# Patient Record
Sex: Male | Born: 1950 | Race: White | Hispanic: No | Marital: Single | Smoking: Never smoker
Health system: Southern US, Community
[De-identification: ages and names within clinical notes are randomized; demographics above are authoritative.]

## PROBLEM LIST (undated history)

## (undated) DIAGNOSIS — J302 Other seasonal allergic rhinitis: Secondary | ICD-10-CM

## (undated) HISTORY — PX: HERNIA REPAIR: SHX51

## (undated) HISTORY — DX: Other seasonal allergic rhinitis: J30.2

---

## 2015-05-29 ENCOUNTER — Other Ambulatory Visit (INDEPENDENT_AMBULATORY_CARE_PROVIDER_SITE_OTHER): Payer: BLUE CROSS/BLUE SHIELD

## 2015-05-29 ENCOUNTER — Encounter: Payer: Self-pay | Admitting: Internal Medicine

## 2015-05-29 ENCOUNTER — Ambulatory Visit (INDEPENDENT_AMBULATORY_CARE_PROVIDER_SITE_OTHER): Payer: BLUE CROSS/BLUE SHIELD | Admitting: Internal Medicine

## 2015-05-29 VITALS — BP 150/98 | HR 81 | Ht 68.0 in | Wt 231.0 lb

## 2015-05-29 DIAGNOSIS — R058 Other specified cough: Secondary | ICD-10-CM

## 2015-05-29 DIAGNOSIS — R0600 Dyspnea, unspecified: Secondary | ICD-10-CM

## 2015-05-29 DIAGNOSIS — R05 Cough: Secondary | ICD-10-CM

## 2015-05-29 LAB — BASIC METABOLIC PANEL
BUN: 31 mg/dL — ABNORMAL HIGH (ref 6–23)
CHLORIDE: 106 meq/L (ref 96–112)
CO2: 27 meq/L (ref 19–32)
CREATININE: 0.95 mg/dL (ref 0.40–1.50)
Calcium: 9.5 mg/dL (ref 8.4–10.5)
GFR: 84.97 mL/min (ref >60.00)
GLUCOSE: 103 mg/dL — AB (ref 70–99)
Potassium: 4 mEq/L (ref 3.5–5.1)
SODIUM: 139 meq/L (ref 135–145)

## 2015-05-29 LAB — CBC WITH DIFFERENTIAL/PLATELET
Basophils Absolute: 0 10*3/uL (ref 0.0–0.1)
Basophils Relative: 0.5 % (ref 0.0–3.0)
Eosinophils Absolute: 0.1 10*3/uL (ref 0.0–0.7)
Eosinophils Relative: 2.1 % (ref 0.0–5.0)
HCT: 50.2 % (ref 39.0–52.0)
Hemoglobin: 16.8 g/dL (ref 13.0–17.0)
LYMPHS ABS: 2.1 10*3/uL (ref 0.7–4.0)
LYMPHS PCT: 30.9 % (ref 12.0–46.0)
MCHC: 33.4 g/dL (ref 30.0–36.0)
MCV: 88.4 fl (ref 78.0–100.0)
MONO ABS: 0.6 10*3/uL (ref 0.1–1.0)
Monocytes Relative: 8.2 % (ref 3.0–12.0)
NEUTROS ABS: 4 10*3/uL (ref 1.4–7.7)
Neutrophils Relative %: 58.3 % (ref 43.0–77.0)
Platelets: 193 10*3/uL (ref 150.0–400.0)
RBC: 5.68 Mil/uL (ref 4.22–5.81)
RDW: 14.1 % (ref 11.5–15.5)
WBC: 6.8 10*3/uL (ref 4.0–10.5)

## 2015-05-29 LAB — BRAIN NATRIURETIC PEPTIDE: Pro B Natriuretic peptide (BNP): 11 pg/mL (ref 0.0–100.0)

## 2015-05-29 LAB — TSH: TSH: 3 u[IU]/mL (ref 0.35–4.50)

## 2015-05-29 MED ORDER — FAMOTIDINE 20 MG PO TABS: ORAL_TABLET | ORAL | Status: DC

## 2015-05-29 MED ORDER — PANTOPRAZOLE SODIUM 40 MG PO TBEC: 40.0000 mg | DELAYED_RELEASE_TABLET | Freq: Every day | ORAL | Status: DC

## 2015-05-29 NOTE — Progress Notes (Signed)
   Subjective:    Patient ID: Jerry Ramos, male    DOB: 1951-03-13      MRN: [id number]  HPI  29 yowm never smoker with longstanding (? Early 2000s) winter time stuffy nose then  late summer/fall 2015 noted onset of sob/cough/choking esp bending over eval  By Dr Harrington Challenger x 2 and  uc x 2 rx with depo shot by Dr Harrington Challenger helped a lot for several days but then worse again and also inhaler made cough worse so referred to pulmonary clinic 05/29/2015 by Dr Harrington Challenger.   05/29/2015 1st [facility] office visit/ Jerry Ramos   Chief Complaint  Patient presents with  . Pulmonary Consult    Referred by Dr. Harrington Challenger for SOB and cough. Pt c/o SOB and wheezing with activity, prod cough with clear mucus. Denies any chest tightness/congestion.    sleeps in recline position x 10 year ? Related to obesity. abuptly worse since late     Review of Systems  Constitutional: Negative for fever and unexpected weight change.  HENT: Positive for sneezing. Negative for congestion, dental problem, ear pain, nosebleeds, postnasal drip, rhinorrhea, sinus pressure, sore throat and trouble swallowing.   Eyes: Negative for redness and itching.  Respiratory: Positive for cough, shortness of breath and wheezing. Negative for chest tightness.   Cardiovascular: Positive for leg swelling. Negative for palpitations.  Gastrointestinal: Negative for nausea and vomiting.  Genitourinary: Negative for dysuria.  Musculoskeletal: Positive for joint swelling.  Skin: Negative for rash.  Neurological: Negative for headaches.  Hematological: Bruises/bleeds easily.  Psychiatric/Behavioral: Negative for dysphoric mood. The patient is not nervous/anxious.        Objective:   Physical Exam  amb wm nad voice fatigue  Extremely harsh violent upper airway cough   Wt Readings from Last 3 Encounters:  05/29/15 231 lb (104.781 kg)    Vital signs reviewed   HEENT: nl dentition, turbinates, and orophanx. Nl external ear canals without cough  reflex   NECK :  without JVD/Nodes/TM/ nl carotid upstrokes bilaterally   LUNGS: no acc muscle use, clear to A and P bilaterally without cough on insp or exp maneuvers   CV:  RRR  no s3 or murmur or increase in P2, no edema   ABD:  soft and nontender with nl excursion in the supine position. No bruits or organomegaly, bowel sounds nl  MS:  warm without deformities, calf tenderness, cyanosis or clubbing  SKIN: warm and dry without lesions    NEURO:  alert, approp, no deficits    cxr 1st week May 2016 ok per pt    Lab 05/29/15 1622  NA 139  K 4.0  CL 106  CO2 27  BUN 31*  CREATININE 0.95  GLUCOSE 103*       Lab 05/29/15 1622  HGB 16.8  HCT 50.2  WBC 6.8  PLT 193.0     Lab Results  Component Value Date   TSH 3.00 05/29/2015     Lab Results  Component Value Date   PROBNP 11.0 05/29/2015      Lab Results  Component Value Date   DDIMER 0.27 05/29/2015          Assessment & Plan:

## 2015-05-29 NOTE — Assessment & Plan Note (Addendum)
The most common causes of chronic cough in immunocompetent adults include the following: upper airway cough syndrome (UACS), previously referred to as postnasal drip syndrome (PNDS), which is caused by variety of rhinosinus conditions; (2) asthma; (3) GERD; (4) chronic bronchitis from cigarette smoking or other inhaled environmental irritants; (5) nonasthmatic eosinophilic bronchitis; and (6) bronchiectasis.   These conditions, singly or in combination, have accounted for up to 94% of the causes of chronic cough in prospective studies.   Other conditions have constituted no >6% of the causes in prospective studies These have included bronchogenic carcinoma, chronic interstitial pneumonia, sarcoidosis, left ventricular failure, ACEI-induced cough, and aspiration from a condition associated with pharyngeal dysfunction.    Chronic cough is often simultaneously caused by more than one condition. A single cause has been found from 38 to 82% of the time, multiple causes from 18 to 62%. Multiply caused cough has been the result of three diseases up to 42% of the time.       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   These are the same pts (now being labeled as having "irritable larynx syndrome" by some cough centers) who not infrequently have a history of having failed to tolerate ace inhibitors,  dry powder inhalers or biphosphonates or report having atypical reflux symptoms that don't respond to standard doses of PPI , and are easily confused as  having aecopd or asthma flares by even experienced allergists/ pulmonologists.   The first step is to maximize acid suppression / diet rx then regroup if the cough persists after 4 weeks rx.  Comment:  The cough he demonstrated today bending over along with sudder hypersensivity to irritating fumes are typical of GERD related cough   See instructions for specific recommendations which were reviewed directly with the patient who was given a copy with highlighter outlining the key components.

## 2015-05-29 NOTE — Patient Instructions (Addendum)
  Pantoprazole (protonix) 40 mg   Take  30-60 min before first meal of the day and Pepcid (famotidine)  20 mg one @  bedtime until return to office - this is the best way to tell whether stomach acid is contributing to your problem.    GERD (REFLUX)  is an extremely common cause of respiratory symptoms just like yours , many times with no obvious heartburn at all.    It can be treated with medication, but also with lifestyle changes including avoidance of late meals, elevation of the head of your bed (ideally with 6 inch  bed blocks) excessive alcohol, smoking cessation, and avoid fatty foods, chocolate, peppermint, colas, red wine, and acidic juices such as orange juice.  NO MINT OR MENTHOL PRODUCTS SO NO COUGH DROPS  USE SUGARLESS CANDY INSTEAD (Jolley ranchers or Stover's or Life Savers) or even ice chips will also do - the key is to swallow to prevent all throat clearing. NO OIL BASED VITAMINS - use powdered substitutes.  Please remember to go to the lab department downstairs for your tests - we will call you with the results when they are available.  Pulmonary f/u is prn if better, back in 4 weeks if not

## 2015-05-30 ENCOUNTER — Encounter: Payer: Self-pay | Admitting: Internal Medicine

## 2015-05-30 LAB — D-DIMER, QUANTITATIVE (NOT AT ARMC): D DIMER QUANT: 0.27 ug{FEU}/mL (ref 0.00–0.48)

## 2015-05-30 NOTE — Assessment & Plan Note (Addendum)
-   05/29/2015  Walked RA x 3 laps @ 185 ft each stopped due to end of study, brisk pace, sats 91% at end but no sob       Symptoms are markedly disproportionate to objective findings and not clear this is a lung problem but pt does appear to have difficult airway management issues.  DDX of  difficult airways management all start with A and  include Adherence, Ace Inhibitors, Acid Reflux, Active Sinus Disease, Alpha 1 Antitripsin deficiency, Anxiety masquerading as Airways dz,  ABPA,  allergy(esp in young), Aspiration (esp in elderly), Adverse effects of meds,  Active smokers, A bunch of PE's (a small clot burden can't cause this syndrome unless there is already severe underlying pulm or vascular dz with poor reserve - and this low a d dimer excludes PE from ddx) ) plus two Bs  = Bronchiectasis and Beta blocker use..and one C= CHF   Cross referencing with his cough I strongly suspect GERD here with UACS/ pseudoasthma. See uacs

## 2015-06-01 LAB — ALLERGY FULL PROFILE
Allergen,Goose feathers, e70: <0.1 kU/L
Alternaria Alternata: <0.1 kU/L
Bahia Grass: <0.1 kU/L
Bermuda Grass: <0.1 kU/L
Box Elder IgE: <0.1 kU/L
Common Ragweed: <0.1 kU/L
Curvularia lunata: <0.1 kU/L
D. farinae: <0.1 kU/L
Elm IgE: <0.1 kU/L
G005 Rye, Perennial: <0.1 kU/L
G009 Red Top: <0.1 kU/L
Helminthosporium halodes: <0.1 kU/L
House Dust Hollister: <0.1 kU/L
IgE (Immunoglobulin E), Serum: 25 kU/L (ref <115)
Lamb's Quarters: <0.1 kU/L
Oak: <0.1 kU/L
Plantain: <0.1 kU/L
Stemphylium Botryosum: <0.1 kU/L
Sycamore Tree: <0.1 kU/L

## 2015-06-17 ENCOUNTER — Telehealth: Payer: Self-pay | Admitting: Internal Medicine

## 2015-06-17 NOTE — Telephone Encounter (Signed)
Labs done 05/29/15--no result note. Please advise MW thanks

## 2015-06-17 NOTE — Telephone Encounter (Signed)
I am sorry but I must have clicked on the wrong button and closed it but should have been called back completely nl with no evidence of a kidney or thryroid or heart problem blood clot or resp allergy so that narrows the usual list of suspects considerably and if not doing better ov with all active meds in hand to re group

## 2015-06-17 NOTE — Telephone Encounter (Signed)
lmomtcb x1 

## 2015-06-18 NOTE — Telephone Encounter (Signed)
lmtcb X 2 

## 2015-06-19 NOTE — Telephone Encounter (Signed)
LMTCB x 3 Letter mailed to patient to call back for results.  Will close message per triage protocol.

## 2015-06-23 ENCOUNTER — Telehealth: Payer: Self-pay | Admitting: Internal Medicine

## 2015-06-23 NOTE — Telephone Encounter (Signed)
Tanda Rockers, MD at 06/17/2015 3:06 PM     Status: Signed       Expand All Collapse All   I am sorry but I must have clicked on the wrong button and closed it but should have been called back completely nl with no evidence of a kidney or thryroid or heart problem blood clot or resp allergy so that narrows the usual list of suspects considerably and if not doing better ov with all active meds in hand to re group        Helene Kelp is aware of results. Nothing further was needed.

## 2015-06-26 ENCOUNTER — Telehealth: Payer: Self-pay | Admitting: Internal Medicine

## 2015-06-26 DIAGNOSIS — R059 Cough, unspecified: Secondary | ICD-10-CM

## 2015-06-26 DIAGNOSIS — R05 Cough: Secondary | ICD-10-CM

## 2015-06-26 NOTE — Telephone Encounter (Addendum)
Spoke with Joellen Jersey and Vallarie Mare to determine what to do with this TE: [facility] at [phone], spoke with billing dept. Gave them new diagnosis code for Cough R05; they will resubmit the bill with new diagnosis  Called and spoke to Helene Kelp, gave her [facility] phone number so she can contact them for follow up. Nothing further needed.

## 2015-06-26 NOTE — Telephone Encounter (Signed)
Patient received statement from [facility] lab for Allergy testing.  CPT Code [phone] They called the insurance company this morning and [employer] told them that they would not pay for this because they did not see that this test was medically necessary. Called - [phone] [employer] [place].  Called to speak to [facility] and they told me that I had to contact [place] [facility] [place] - [phone] [employer] main line Reference No. [id number] [place] [facility] [place] [phone], they told me to contact [facility]  Not sure what to do at this point... MW - any suggestions?

## 2015-06-26 NOTE — Telephone Encounter (Signed)
Jerry Ramos returning call call back [phone].Jerry Ramos

## 2015-06-26 NOTE — Telephone Encounter (Signed)
Left message for Jerry Ramos to call back.

## 2015-06-26 NOTE — Telephone Encounter (Signed)
I may have listed it under dyspnea but meant to place it under cough - if it's not approved for cough that's fine we can cancel it for now

## 2015-11-27 ENCOUNTER — Ambulatory Visit (INDEPENDENT_AMBULATORY_CARE_PROVIDER_SITE_OTHER): Payer: BLUE CROSS/BLUE SHIELD | Admitting: Internal Medicine

## 2015-11-27 ENCOUNTER — Encounter: Payer: Self-pay | Admitting: Internal Medicine

## 2015-11-27 ENCOUNTER — Ambulatory Visit (INDEPENDENT_AMBULATORY_CARE_PROVIDER_SITE_OTHER)
Admission: RE | Admit: 2015-11-27 | Discharge: 2015-11-27 | Disposition: A | Discharge status: Home / Self Care | Payer: BLUE CROSS/BLUE SHIELD | Source: Ambulatory Visit | Attending: Internal Medicine | Admitting: Internal Medicine

## 2015-11-27 VITALS — BP 130/100 | HR 78 | Ht 68.0 in | Wt 233.6 lb

## 2015-11-27 DIAGNOSIS — R05 Cough: Secondary | ICD-10-CM

## 2015-11-27 DIAGNOSIS — R058 Other specified cough: Secondary | ICD-10-CM

## 2015-11-27 DIAGNOSIS — R0600 Dyspnea, unspecified: Secondary | ICD-10-CM | POA: Diagnosis not present

## 2015-11-27 MED ORDER — PANTOPRAZOLE SODIUM 40 MG PO TBEC: 40.0000 mg | DELAYED_RELEASE_TABLET | Freq: Every day | ORAL | Status: AC

## 2015-11-27 MED ORDER — FAMOTIDINE 20 MG PO TABS: ORAL_TABLET | ORAL | Status: AC

## 2015-11-27 NOTE — Patient Instructions (Addendum)
Resume Pantoprazole (protonix) 40 mg   Take  30-60 min before first meal of the day and Pepcid (famotidine)  20 mg one @  bedtime until return to office - this is the best way to tell whether stomach acid is contributing to your problem.   GERD (REFLUX)  is an extremely common cause of respiratory symptoms just like yours , many times with no obvious heartburn at all.    It can be treated with medication, but also with lifestyle changes including elevation of the head of your bed (ideally with 6 inch  bed blocks),  Smoking cessation, avoidance of late meals, excessive alcohol, and avoid fatty foods, chocolate, peppermint, colas, red wine, and acidic juices such as orange juice.  NO MINT OR MENTHOL PRODUCTS SO NO COUGH DROPS  USE SUGARLESS CANDY INSTEAD (Jolley ranchers or Stover's or Life Savers) or even ice chips will also do - the key is to swallow to prevent all throat clearing. NO OIL BASED VITAMINS - use powdered substitutes.   Please see patient coordinator before you leave today  to schedule sinus ct and diagnostic esophagram   Please remember to go to the   x-ray department downstairs for your tests - we will call you with the results when they are available.   Weight control is simply a matter of calorie balance which needs to be tilted in your favor by eating less and exercising more.  To get the most out of exercise, you need to be continuously aware that you are short of breath, but never out of breath, for 30 minutes daily. As you improve, it will actually be easier for you to do the same amount of exercise  in  30 minutes so always push to the level where you are short of breath.  If this does not result in gradual weight reduction then I strongly recommend you see a nutritionist(per Dr Harrington Challenger) with a food diary x 2 weeks so that you can work out a negative calorie balance which is universally effective in steady weight loss programs.  Think of your calorie balance like you do your bank  account where in this case you want the balance to go down so you must take in less calories than you burn up.  It's just that simple:  Hard to do, but easy to understand.  Good luck!   Pulmonary follow up is as needed

## 2015-11-27 NOTE — Progress Notes (Signed)
Subjective:    Patient ID: Jerry Ramos, male    DOB: [birth year]      MRN: [id number]    Brief patient profile:  11 yowm never smoker with longstanding (? Early 2000s) winter time stuffy nose then  late summer/fall 2015 noted onset of sob/cough/choking esp bending over eval  By Dr Harrington Challenger x 2 and  uc x 2 rx with depo shot by Dr Harrington Challenger helped a lot for several days but then worse again and also inhaler made cough worse so referred to pulmonary clinic 05/29/2015 by Dr Harrington Challenger.     History of Present Illness  05/29/2015 1st [facility] office visit/ Shunda Rabadi   Chief Complaint  Patient presents with  . Pulmonary Consult    Referred by Dr. Harrington Challenger for SOB and cough. Pt c/o SOB and wheezing with activity, prod cough with clear mucus. Denies any chest tightness/congestion.    freq sleeps in recline position x 10 year ? Related to obesity.  Most the time when sleeping just flat with 2 pillows  rec Pantoprazole (protonix) 40 mg   Take  30-60 min before first meal of the day and Pepcid (famotidine)  20 mg one @  bedtime until return to office - this is the best way to tell whether stomach acid is contributing to your problem.   GERD diet   .Pulmonary f/u is prn if better, back in 4 weeks if not > was some  better until stopped med     11/27/2015  f/u ov/Phiona Ramnauth re: uacs worse off gerd rx Chief Complaint  Patient presents with  . Follow-up    Pt following for Dyspnea: pt states his breathing is about the same since he last been here. pt stateds the pepcid and protonix didnt help any with his issues. pt c/o SOB with even just putting on his boots and lacing them up, wheezing, and prod cough pale in color. pt also c/o hot flashes.  no c/o chest tightness.    Can walk flat fine, the problem is bending over > immediate sob > cough / min productive  No obvious day to day or daytime variability or assoc  cp or chest tightness, subjective wheeze or overt sinus or hb symptoms. No unusual exp hx or h/o childhood  pna/ asthma or knowledge of premature birth.  Sleeping ok without nocturnal  or early am exacerbation  of respiratory  c/o's or need for noct saba. Also denies any obvious fluctuation of symptoms with weather or environmental changes or other aggravating or alleviating factors except as outlined above   Current Medications, Allergies, Complete Past Medical History, Past Surgical History, Family History, and Social History were reviewed in [employer] record.  ROS  The following are not active complaints unless bolded sore throat, dysphagia, dental problems, itching, sneezing,  nasal congestion or excess/ purulent secretions, ear ache,   fever, chills, sweats, unintended wt loss, classically pleuritic or exertional cp, hemoptysis,  orthopnea pnd or leg swelling, presyncope, palpitations, abdominal pain, anorexia, nausea, vomiting, diarrhea  or change in bowel or bladder habits, change in stools or urine, dysuria,hematuria,  rash, arthralgias, visual complaints, headache, numbness, weakness or ataxia or problems with walking or coordination,  change in mood/affect or memory.         Objective:   Physical Exam  amb wm nad classic voice fatigue  / hopeless/ helpless affect     Wt Readings from Last 3 Encounters:  11/27/15 233 lb 9.6 oz (105.96 kg)  05/29/15 231 lb (104.781 kg)    Vital signs reviewed      HEENT: nl dentition, turbinates, and orophanx. Nl external ear canals without cough reflex   NECK :  without JVD/Nodes/TM/ nl carotid upstrokes bilaterally   LUNGS: no acc muscle use, clear to A and P bilaterally without cough on insp or exp maneuvers   CV:  RRR  no s3 or murmur or increase in P2, no edema   ABD:  Massively obese but soft and nontender with limited excursion in the supine position. No bruits or organomegaly, bowel sounds nl  MS:  warm without deformities, calf tenderness, cyanosis or clubbing  SKIN: warm and dry without lesions    NEURO:   alert, approp, no deficits       CXR PA and Lateral:   11/27/2015 :    I personally reviewed images and agree with radiology impression as follows:   No active cardiopulmonary disease.

## 2015-11-29 NOTE — Assessment & Plan Note (Signed)
Complicated by intermittent hbp/ restrictive lung physiology by spirometry 11/27/2015   Body mass index is 35.53   Lab Results  Component Value Date   TSH 3.00 05/29/2015     Contributing to gerd tendency/ doe/reviewed the need and the process to achieve and maintain neg calorie balance > defer f/u primary care including intermittently monitoring thyroid status

## 2015-11-29 NOTE — Assessment & Plan Note (Signed)
-   05/29/2015  Walked RA x 3 laps @ 185 ft each stopped due to end of study, brisk pace, sats 91% at end but no sob     - spirometry 11/27/2015 purely restrictive   Since no sob walking rec increase sub max ex to help get into neg calorie balance

## 2015-11-29 NOTE — Assessment & Plan Note (Addendum)
-   Allergy profile 05/29/15 >  Eos 0.1/ IgE 25   Neg RAST - max gerd rx 05/29/2015 > some better on it/ some worse off  Still very strongly suspect  Classic Upper airway cough syndrome, so named because it's frequently impossible to sort out how much is  CR/sinusitis with freq throat clearing (which can be related to primary GERD)   vs  causing  secondary (" extra esophageal")  GERD from wide swings in gastric pressure that occur with throat clearing, often  promoting self use of mint and menthol lozenges that reduce the lower esophageal sphincter tone and exacerbate the problem further in a cyclical fashion.   These are the same pts (now being labeled as having "irritable larynx syndrome" by some cough centers) who not infrequently have a history of having failed to tolerate ace inhibitors,  dry powder inhalers or biphosphonates or report having atypical reflux symptoms that don't respond to standard doses of PPI , and are easily confused as having aecopd or asthma flares by even experienced allergists/ pulmonologists.  The fact his breathing /coughing are fine until bends overy is very stronlgy indicative of gerd as the driving force behind the airway instability plus the restrictive effect on diaphragm excursion  I had an extended discussion with the patient reviewing all relevant studies completed to date and  lasting 15 to 20 minutes of a 25 minute visit on the following ongoing concerns:   1) The standardized cough guidelines published in Chest by Lissa Morales in 2006 are still the best available and consist of a multiple step process (up to 12!) , not a single office visit,  and are intended  to address this problem logically,  with an alogrithm dependent on response to empiric treatment at  each progressive step  to determine a specific diagnosis with  minimal addtional testing needed. Therefore if adherence is an issue or can't be accurately verified,  it's very unlikely the standard evaluation and  treatment will be successful here.    Furthermore, response to therapy (other than acute cough suppression, which should only be used short term with avoidance of narcotic containing cough syrups if possible), can be a gradual process for which the patient may perceive immediate benefit.  Unlike going to an eye doctor where the best perscription is almost always the first one and is immediately effective, this is almost never the case in the management of chronic cough syndromes. Therefore the patient needs to commit up front to consistently adhere to recommendations  for up to 6 weeks of therapy directed at the likely underlying problem(s) before the response can be reasonably evaluated.   2) next steps are Esophagram and sinus CT > ordered    3) Each maintenance medication was reviewed in detail including most importantly the difference between maintenance and as needed and under what circumstances the prns are to be used.  Please see instructions for details which were reviewed in writing and the patient given a copy.

## 2015-12-11 ENCOUNTER — Other Ambulatory Visit: Payer: Self-pay | Admitting: Internal Medicine

## 2015-12-11 ENCOUNTER — Ambulatory Visit
Admission: RE | Admit: 2015-12-11 | Discharge: 2015-12-11 | Disposition: A | Discharge status: Home / Self Care | Payer: BLUE CROSS/BLUE SHIELD | Source: Ambulatory Visit | Attending: Internal Medicine | Admitting: Internal Medicine

## 2015-12-11 DIAGNOSIS — R058 Other specified cough: Secondary | ICD-10-CM

## 2015-12-11 DIAGNOSIS — R05 Cough: Secondary | ICD-10-CM

## 2015-12-11 NOTE — Progress Notes (Signed)
Quick Note:  Spoke with pt and notified of results per Dr. Wert. Pt verbalized understanding and denied any questions.  ______ 

## 2016-09-28 IMAGING — CT CT PARANASAL SINUSES LIMITED
1 series · 10 of 12 positions shown, 13 images · non-contrast
Comparison: None.

CLINICAL DATA: Chronic maxillary sinus pressure and congestion.
Initial encounter.

EXAM:
CT PARANASAL SINUS LIMITED WITHOUT CONTRAST
TECHNIQUE: Non-contiguous multidetector CT images of the paranasal sinuses were
obtained in a single plane without contrast.

[Series 3: coronal soft · axial · 0.33mm/px · z∈[+36,+126]mm · 10 of 12 slices shown, 13 images]
[im 2/12  brain]
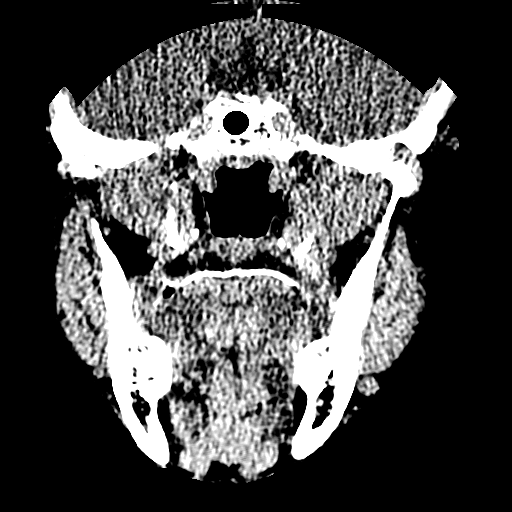
[im 2/12  bone]
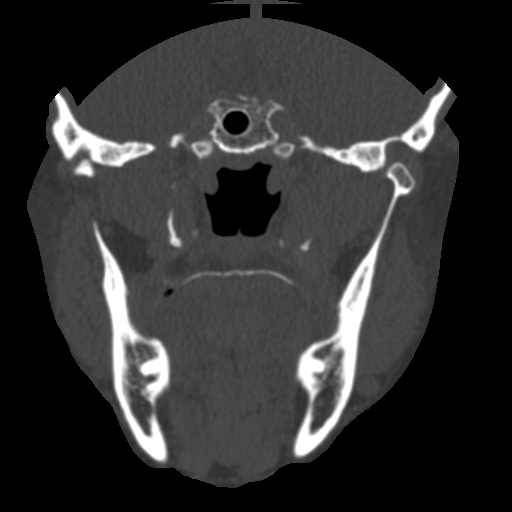
[im 3/12  bone]
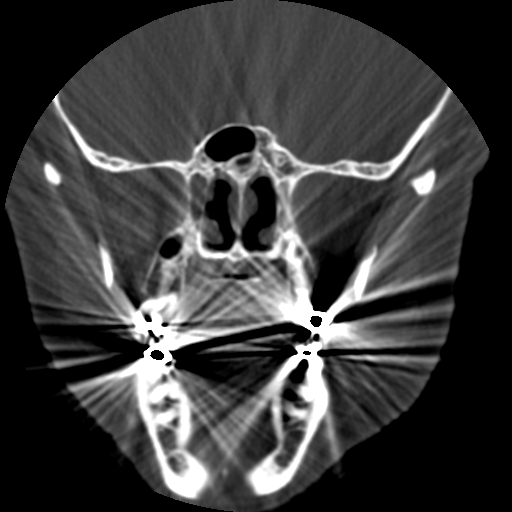
[im 4/12  bone]
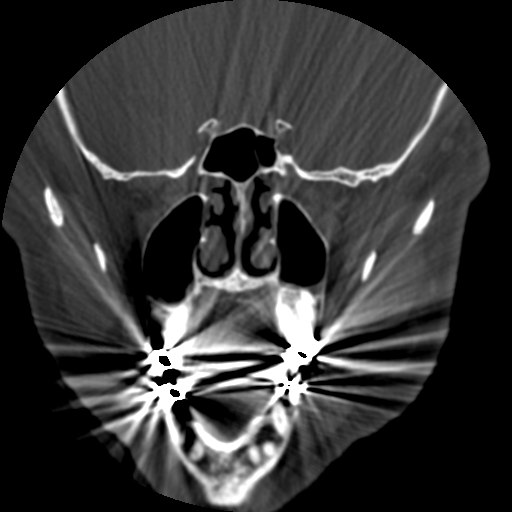
[im 5/12  bone]
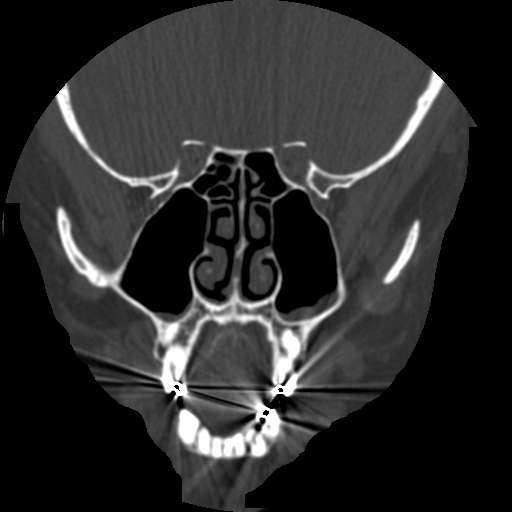
[im 6/12  brain]
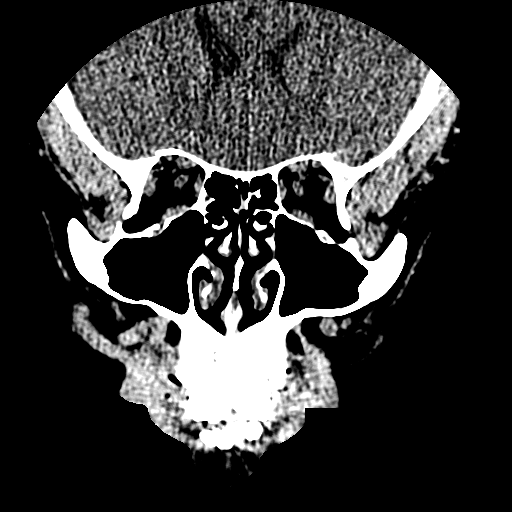
[im 6/12  bone]
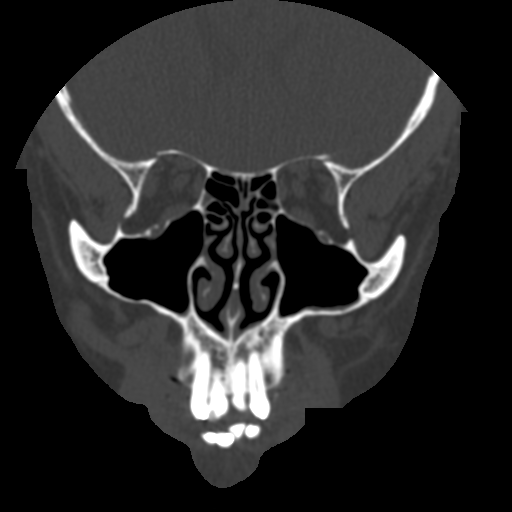
[im 7/12  bone]
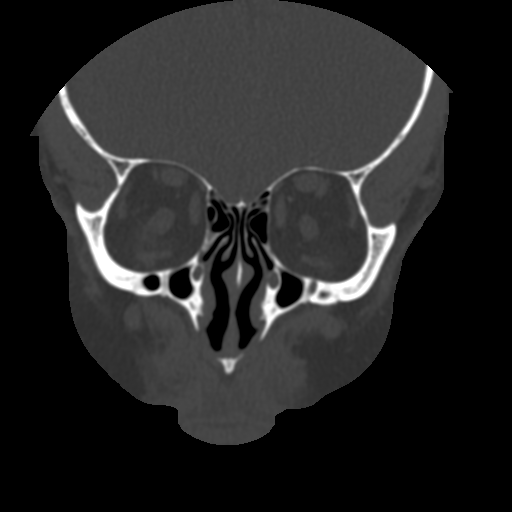
[im 8/12  bone]
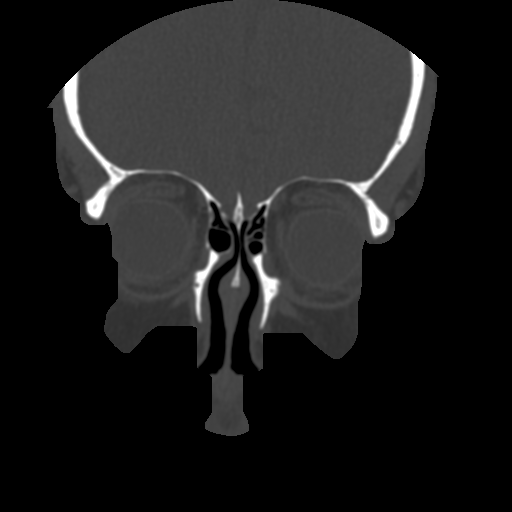
[im 9/12  bone]
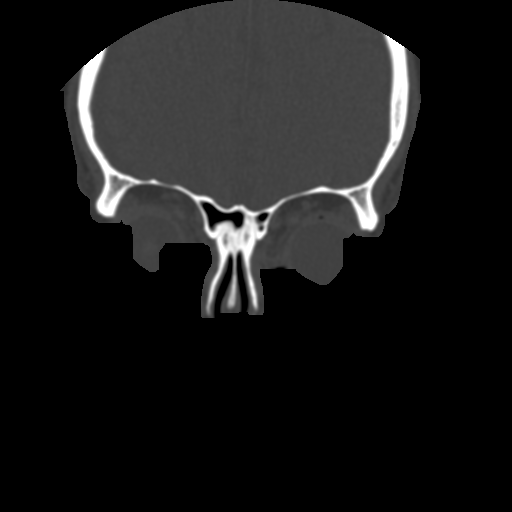
[im 10/12  brain]
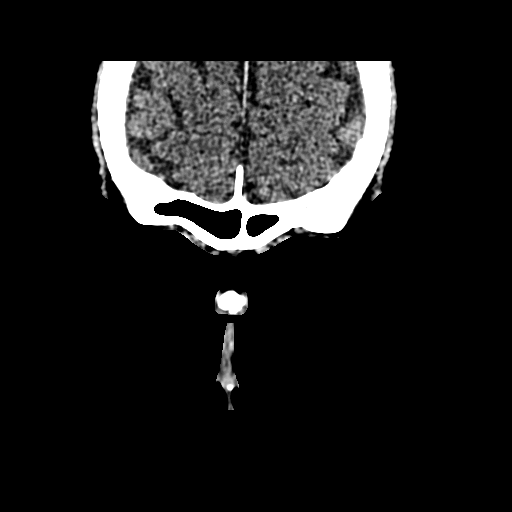
[im 10/12  bone]
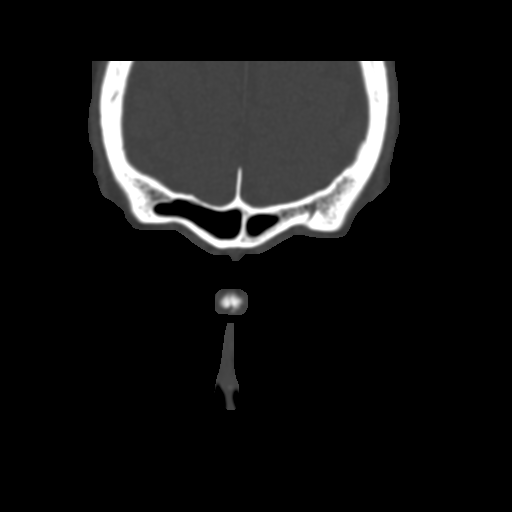
[im 11/12  bone]
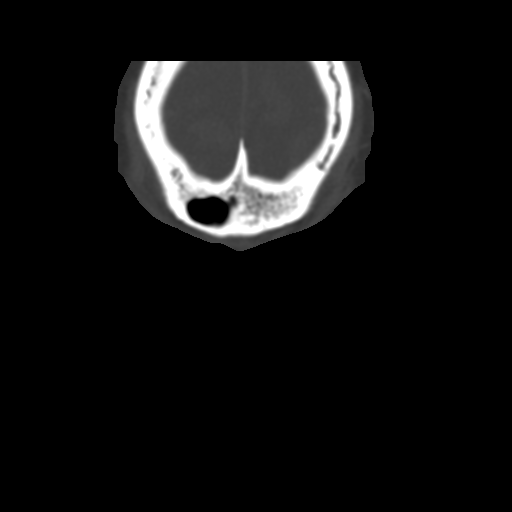

[10 of 12 positions shown; findings below may reference images not displayed]

FINDINGS: Very mild mucosal thickening is seen in the floors of the maxillary
sinuses, slightly greater on the right. Imaged paranasal sinuses are
otherwise clear. No sclerosis or thickening of the walls of the
paranasal sinuses is present. No air-fluid levels are identified in
the any of the sinuses. The examination is otherwise unremarkable.
IMPRESSION: Minimal mucosal thickening in the floors of the maxillary sinuses.
The examination is otherwise negative.

## 2017-01-30 DIAGNOSIS — R05 Cough: Secondary | ICD-10-CM | POA: Diagnosis not present

## 2017-01-30 DIAGNOSIS — J45909 Unspecified asthma, uncomplicated: Secondary | ICD-10-CM | POA: Diagnosis not present

## 2017-03-14 DIAGNOSIS — J45909 Unspecified asthma, uncomplicated: Secondary | ICD-10-CM | POA: Diagnosis not present

## 2017-03-14 DIAGNOSIS — I10 Essential (primary) hypertension: Secondary | ICD-10-CM | POA: Diagnosis not present

## 2017-06-26 DIAGNOSIS — R05 Cough: Secondary | ICD-10-CM | POA: Diagnosis not present

## 2017-06-26 DIAGNOSIS — R0609 Other forms of dyspnea: Secondary | ICD-10-CM | POA: Diagnosis not present

## 2017-06-26 DIAGNOSIS — R0600 Dyspnea, unspecified: Secondary | ICD-10-CM | POA: Diagnosis not present

## 2017-06-26 DIAGNOSIS — J329 Chronic sinusitis, unspecified: Secondary | ICD-10-CM | POA: Diagnosis not present

## 2017-07-07 DIAGNOSIS — R05 Cough: Secondary | ICD-10-CM | POA: Diagnosis not present

## 2017-07-07 DIAGNOSIS — R0600 Dyspnea, unspecified: Secondary | ICD-10-CM | POA: Diagnosis not present

## 2017-07-28 DIAGNOSIS — R05 Cough: Secondary | ICD-10-CM | POA: Diagnosis not present

## 2017-08-30 DIAGNOSIS — Z0000 Encounter for general adult medical examination without abnormal findings: Secondary | ICD-10-CM | POA: Diagnosis not present

## 2017-08-30 DIAGNOSIS — Z23 Encounter for immunization: Secondary | ICD-10-CM | POA: Diagnosis not present

## 2017-08-30 DIAGNOSIS — Z1159 Encounter for screening for other viral diseases: Secondary | ICD-10-CM | POA: Diagnosis not present

## 2017-08-30 DIAGNOSIS — Z125 Encounter for screening for malignant neoplasm of prostate: Secondary | ICD-10-CM | POA: Diagnosis not present

## 2017-08-30 DIAGNOSIS — Z13220 Encounter for screening for lipoid disorders: Secondary | ICD-10-CM | POA: Diagnosis not present

## 2017-08-30 DIAGNOSIS — I10 Essential (primary) hypertension: Secondary | ICD-10-CM | POA: Diagnosis not present

## 2017-11-06 DIAGNOSIS — R05 Cough: Secondary | ICD-10-CM | POA: Diagnosis not present

## 2017-11-06 DIAGNOSIS — R0602 Shortness of breath: Secondary | ICD-10-CM | POA: Diagnosis not present

## 2017-11-13 DIAGNOSIS — R39198 Other difficulties with micturition: Secondary | ICD-10-CM | POA: Diagnosis not present

## 2017-11-14 DIAGNOSIS — H40013 Open angle with borderline findings, low risk, bilateral: Secondary | ICD-10-CM | POA: Diagnosis not present

## 2017-11-14 DIAGNOSIS — H2513 Age-related nuclear cataract, bilateral: Secondary | ICD-10-CM | POA: Diagnosis not present

## 2017-11-17 DIAGNOSIS — R05 Cough: Secondary | ICD-10-CM | POA: Diagnosis not present

## 2017-11-17 DIAGNOSIS — R918 Other nonspecific abnormal finding of lung field: Secondary | ICD-10-CM | POA: Diagnosis not present

## 2018-01-11 DIAGNOSIS — H53412 Scotoma involving central area, left eye: Secondary | ICD-10-CM | POA: Diagnosis not present

## 2018-01-11 DIAGNOSIS — H40023 Open angle with borderline findings, high risk, bilateral: Secondary | ICD-10-CM | POA: Diagnosis not present

## 2018-01-22 DIAGNOSIS — R490 Dysphonia: Secondary | ICD-10-CM | POA: Diagnosis not present

## 2018-01-22 DIAGNOSIS — J383 Other diseases of vocal cords: Secondary | ICD-10-CM | POA: Diagnosis not present

## 2018-01-22 DIAGNOSIS — R05 Cough: Secondary | ICD-10-CM | POA: Diagnosis not present

## 2018-02-05 DIAGNOSIS — J383 Other diseases of vocal cords: Secondary | ICD-10-CM | POA: Diagnosis not present

## 2018-02-05 DIAGNOSIS — K219 Gastro-esophageal reflux disease without esophagitis: Secondary | ICD-10-CM | POA: Diagnosis not present

## 2018-02-05 DIAGNOSIS — K224 Dyskinesia of esophagus: Secondary | ICD-10-CM | POA: Diagnosis not present

## 2018-03-29 DIAGNOSIS — H5340 Unspecified visual field defects: Secondary | ICD-10-CM | POA: Diagnosis not present

## 2018-03-29 DIAGNOSIS — H40023 Open angle with borderline findings, high risk, bilateral: Secondary | ICD-10-CM | POA: Diagnosis not present

## 2018-04-10 DIAGNOSIS — K222 Esophageal obstruction: Secondary | ICD-10-CM | POA: Diagnosis not present

## 2018-04-10 DIAGNOSIS — Z1211 Encounter for screening for malignant neoplasm of colon: Secondary | ICD-10-CM | POA: Diagnosis not present

## 2018-04-10 DIAGNOSIS — R05 Cough: Secondary | ICD-10-CM | POA: Diagnosis not present

## 2018-04-10 DIAGNOSIS — Z1212 Encounter for screening for malignant neoplasm of rectum: Secondary | ICD-10-CM | POA: Diagnosis not present

## 2018-04-27 DIAGNOSIS — K222 Esophageal obstruction: Secondary | ICD-10-CM | POA: Diagnosis not present

## 2018-05-10 DIAGNOSIS — R0602 Shortness of breath: Secondary | ICD-10-CM | POA: Diagnosis not present

## 2018-05-10 DIAGNOSIS — R05 Cough: Secondary | ICD-10-CM | POA: Diagnosis not present

## 2018-05-10 DIAGNOSIS — K222 Esophageal obstruction: Secondary | ICD-10-CM | POA: Diagnosis not present

## 2018-05-11 DIAGNOSIS — R0602 Shortness of breath: Secondary | ICD-10-CM | POA: Diagnosis not present

## 2018-05-11 DIAGNOSIS — R05 Cough: Secondary | ICD-10-CM | POA: Diagnosis not present

## 2018-06-07 DIAGNOSIS — K210 Gastro-esophageal reflux disease with esophagitis: Secondary | ICD-10-CM | POA: Diagnosis not present

## 2018-06-07 DIAGNOSIS — R05 Cough: Secondary | ICD-10-CM | POA: Diagnosis not present

## 2018-06-07 DIAGNOSIS — K2950 Unspecified chronic gastritis without bleeding: Secondary | ICD-10-CM | POA: Diagnosis not present

## 2018-06-07 DIAGNOSIS — K5730 Diverticulosis of large intestine without perforation or abscess without bleeding: Secondary | ICD-10-CM | POA: Diagnosis not present

## 2018-06-07 DIAGNOSIS — K449 Diaphragmatic hernia without obstruction or gangrene: Secondary | ICD-10-CM | POA: Diagnosis not present

## 2018-06-07 DIAGNOSIS — K2980 Duodenitis without bleeding: Secondary | ICD-10-CM | POA: Diagnosis not present

## 2018-06-07 DIAGNOSIS — K3189 Other diseases of stomach and duodenum: Secondary | ICD-10-CM | POA: Diagnosis not present

## 2018-06-07 DIAGNOSIS — Z1211 Encounter for screening for malignant neoplasm of colon: Secondary | ICD-10-CM | POA: Diagnosis not present

## 2018-07-06 DIAGNOSIS — R110 Nausea: Secondary | ICD-10-CM | POA: Diagnosis not present

## 2018-10-02 DIAGNOSIS — H2513 Age-related nuclear cataract, bilateral: Secondary | ICD-10-CM | POA: Diagnosis not present

## 2018-10-02 DIAGNOSIS — H40023 Open angle with borderline findings, high risk, bilateral: Secondary | ICD-10-CM | POA: Diagnosis not present

## 2018-11-22 DIAGNOSIS — R634 Abnormal weight loss: Secondary | ICD-10-CM | POA: Diagnosis not present

## 2018-11-22 DIAGNOSIS — M5417 Radiculopathy, lumbosacral region: Secondary | ICD-10-CM | POA: Diagnosis not present

## 2018-11-22 DIAGNOSIS — G6289 Other specified polyneuropathies: Secondary | ICD-10-CM | POA: Diagnosis not present

## 2018-11-22 DIAGNOSIS — G609 Hereditary and idiopathic neuropathy, unspecified: Secondary | ICD-10-CM | POA: Diagnosis not present

## 2018-11-22 DIAGNOSIS — E538 Deficiency of other specified B group vitamins: Secondary | ICD-10-CM | POA: Diagnosis not present

## 2018-11-22 DIAGNOSIS — R202 Paresthesia of skin: Secondary | ICD-10-CM | POA: Diagnosis not present

## 2018-11-22 DIAGNOSIS — R201 Hypoesthesia of skin: Secondary | ICD-10-CM | POA: Diagnosis not present

## 2018-11-22 DIAGNOSIS — E559 Vitamin D deficiency, unspecified: Secondary | ICD-10-CM | POA: Diagnosis not present

## 2018-11-22 DIAGNOSIS — E531 Pyridoxine deficiency: Secondary | ICD-10-CM | POA: Diagnosis not present

## 2018-11-22 DIAGNOSIS — G5603 Carpal tunnel syndrome, bilateral upper limbs: Secondary | ICD-10-CM | POA: Diagnosis not present

## 2018-11-22 DIAGNOSIS — G2581 Restless legs syndrome: Secondary | ICD-10-CM | POA: Diagnosis not present

## 2018-11-22 DIAGNOSIS — M5412 Radiculopathy, cervical region: Secondary | ICD-10-CM | POA: Diagnosis not present

## 2018-11-22 DIAGNOSIS — G603 Idiopathic progressive neuropathy: Secondary | ICD-10-CM | POA: Diagnosis not present

## 2018-12-03 DIAGNOSIS — R0602 Shortness of breath: Secondary | ICD-10-CM | POA: Diagnosis not present

## 2018-12-03 DIAGNOSIS — K219 Gastro-esophageal reflux disease without esophagitis: Secondary | ICD-10-CM | POA: Diagnosis not present

## 2018-12-03 DIAGNOSIS — I10 Essential (primary) hypertension: Secondary | ICD-10-CM | POA: Diagnosis not present

## 2018-12-03 DIAGNOSIS — R0600 Dyspnea, unspecified: Secondary | ICD-10-CM | POA: Diagnosis not present

## 2018-12-03 DIAGNOSIS — R9389 Abnormal findings on diagnostic imaging of other specified body structures: Secondary | ICD-10-CM | POA: Diagnosis not present

## 2018-12-03 DIAGNOSIS — R05 Cough: Secondary | ICD-10-CM | POA: Diagnosis not present

## 2018-12-03 DIAGNOSIS — K222 Esophageal obstruction: Secondary | ICD-10-CM | POA: Diagnosis not present

## 2018-12-05 DIAGNOSIS — Z23 Encounter for immunization: Secondary | ICD-10-CM | POA: Diagnosis not present

## 2018-12-20 DIAGNOSIS — R202 Paresthesia of skin: Secondary | ICD-10-CM | POA: Diagnosis not present

## 2018-12-20 DIAGNOSIS — G5603 Carpal tunnel syndrome, bilateral upper limbs: Secondary | ICD-10-CM | POA: Diagnosis not present

## 2018-12-20 DIAGNOSIS — G2581 Restless legs syndrome: Secondary | ICD-10-CM | POA: Diagnosis not present

## 2018-12-20 DIAGNOSIS — M542 Cervicalgia: Secondary | ICD-10-CM | POA: Diagnosis not present

## 2018-12-20 DIAGNOSIS — M5417 Radiculopathy, lumbosacral region: Secondary | ICD-10-CM | POA: Diagnosis not present

## 2018-12-21 DIAGNOSIS — Z125 Encounter for screening for malignant neoplasm of prostate: Secondary | ICD-10-CM | POA: Diagnosis not present

## 2018-12-21 DIAGNOSIS — K219 Gastro-esophageal reflux disease without esophagitis: Secondary | ICD-10-CM | POA: Diagnosis not present

## 2018-12-21 DIAGNOSIS — I10 Essential (primary) hypertension: Secondary | ICD-10-CM | POA: Diagnosis not present

## 2019-01-21 DIAGNOSIS — M542 Cervicalgia: Secondary | ICD-10-CM | POA: Diagnosis not present

## 2019-01-21 DIAGNOSIS — G6289 Other specified polyneuropathies: Secondary | ICD-10-CM | POA: Diagnosis not present

## 2019-01-21 DIAGNOSIS — G2581 Restless legs syndrome: Secondary | ICD-10-CM | POA: Diagnosis not present

## 2019-01-21 DIAGNOSIS — M5417 Radiculopathy, lumbosacral region: Secondary | ICD-10-CM | POA: Diagnosis not present

## 2019-01-25 DIAGNOSIS — K219 Gastro-esophageal reflux disease without esophagitis: Secondary | ICD-10-CM | POA: Diagnosis not present

## 2019-01-25 DIAGNOSIS — R0600 Dyspnea, unspecified: Secondary | ICD-10-CM | POA: Diagnosis not present

## 2019-01-25 DIAGNOSIS — I10 Essential (primary) hypertension: Secondary | ICD-10-CM | POA: Diagnosis not present

## 2019-01-25 DIAGNOSIS — R05 Cough: Secondary | ICD-10-CM | POA: Diagnosis not present

## 2019-01-25 DIAGNOSIS — K222 Esophageal obstruction: Secondary | ICD-10-CM | POA: Diagnosis not present

## 2019-01-25 DIAGNOSIS — R0602 Shortness of breath: Secondary | ICD-10-CM | POA: Diagnosis not present

## 2019-01-25 DIAGNOSIS — R9389 Abnormal findings on diagnostic imaging of other specified body structures: Secondary | ICD-10-CM | POA: Diagnosis not present

## 2019-03-14 DIAGNOSIS — R202 Paresthesia of skin: Secondary | ICD-10-CM | POA: Diagnosis not present

## 2019-03-14 DIAGNOSIS — M5417 Radiculopathy, lumbosacral region: Secondary | ICD-10-CM | POA: Diagnosis not present

## 2019-03-14 DIAGNOSIS — M542 Cervicalgia: Secondary | ICD-10-CM | POA: Diagnosis not present

## 2019-03-14 DIAGNOSIS — G2581 Restless legs syndrome: Secondary | ICD-10-CM | POA: Diagnosis not present

## 2019-03-14 DIAGNOSIS — G5603 Carpal tunnel syndrome, bilateral upper limbs: Secondary | ICD-10-CM | POA: Diagnosis not present

## 2019-04-25 DIAGNOSIS — L821 Other seborrheic keratosis: Secondary | ICD-10-CM | POA: Diagnosis not present

## 2019-04-25 DIAGNOSIS — G603 Idiopathic progressive neuropathy: Secondary | ICD-10-CM | POA: Diagnosis not present

## 2019-04-25 DIAGNOSIS — G5603 Carpal tunnel syndrome, bilateral upper limbs: Secondary | ICD-10-CM | POA: Diagnosis not present

## 2019-04-25 DIAGNOSIS — G2581 Restless legs syndrome: Secondary | ICD-10-CM | POA: Diagnosis not present

## 2019-04-25 DIAGNOSIS — M542 Cervicalgia: Secondary | ICD-10-CM | POA: Diagnosis not present

## 2019-04-25 DIAGNOSIS — L570 Actinic keratosis: Secondary | ICD-10-CM | POA: Diagnosis not present

## 2019-05-02 DIAGNOSIS — R05 Cough: Secondary | ICD-10-CM | POA: Diagnosis not present

## 2019-05-02 DIAGNOSIS — R062 Wheezing: Secondary | ICD-10-CM | POA: Diagnosis not present

## 2019-05-02 DIAGNOSIS — J310 Chronic rhinitis: Secondary | ICD-10-CM | POA: Diagnosis not present

## 2019-08-01 DIAGNOSIS — M5417 Radiculopathy, lumbosacral region: Secondary | ICD-10-CM | POA: Diagnosis not present

## 2019-08-01 DIAGNOSIS — G2581 Restless legs syndrome: Secondary | ICD-10-CM | POA: Diagnosis not present

## 2019-08-01 DIAGNOSIS — G603 Idiopathic progressive neuropathy: Secondary | ICD-10-CM | POA: Diagnosis not present

## 2019-08-01 DIAGNOSIS — M542 Cervicalgia: Secondary | ICD-10-CM | POA: Diagnosis not present

## 2019-09-17 DIAGNOSIS — G603 Idiopathic progressive neuropathy: Secondary | ICD-10-CM | POA: Diagnosis not present

## 2019-09-17 DIAGNOSIS — G2581 Restless legs syndrome: Secondary | ICD-10-CM | POA: Diagnosis not present

## 2019-09-17 DIAGNOSIS — M5417 Radiculopathy, lumbosacral region: Secondary | ICD-10-CM | POA: Diagnosis not present

## 2019-09-17 DIAGNOSIS — G5603 Carpal tunnel syndrome, bilateral upper limbs: Secondary | ICD-10-CM | POA: Diagnosis not present

## 2019-09-17 DIAGNOSIS — R202 Paresthesia of skin: Secondary | ICD-10-CM | POA: Diagnosis not present

## 2019-09-17 DIAGNOSIS — M5412 Radiculopathy, cervical region: Secondary | ICD-10-CM | POA: Diagnosis not present

## 2019-10-15 DIAGNOSIS — M542 Cervicalgia: Secondary | ICD-10-CM | POA: Diagnosis not present

## 2019-10-15 DIAGNOSIS — G6289 Other specified polyneuropathies: Secondary | ICD-10-CM | POA: Diagnosis not present

## 2019-10-15 DIAGNOSIS — G2581 Restless legs syndrome: Secondary | ICD-10-CM | POA: Diagnosis not present

## 2019-10-15 DIAGNOSIS — G5601 Carpal tunnel syndrome, right upper limb: Secondary | ICD-10-CM | POA: Diagnosis not present

## 2019-10-15 DIAGNOSIS — M5417 Radiculopathy, lumbosacral region: Secondary | ICD-10-CM | POA: Diagnosis not present

## 2019-10-18 DIAGNOSIS — Z23 Encounter for immunization: Secondary | ICD-10-CM | POA: Diagnosis not present

## 2021-01-12 DIAGNOSIS — M5412 Radiculopathy, cervical region: Secondary | ICD-10-CM | POA: Diagnosis not present

## 2021-01-12 DIAGNOSIS — R202 Paresthesia of skin: Secondary | ICD-10-CM | POA: Diagnosis not present

## 2021-01-12 DIAGNOSIS — M542 Cervicalgia: Secondary | ICD-10-CM | POA: Diagnosis not present

## 2021-01-12 DIAGNOSIS — G5603 Carpal tunnel syndrome, bilateral upper limbs: Secondary | ICD-10-CM | POA: Diagnosis not present

## 2021-02-12 DIAGNOSIS — D239 Other benign neoplasm of skin, unspecified: Secondary | ICD-10-CM | POA: Diagnosis not present

## 2021-02-12 DIAGNOSIS — L578 Other skin changes due to chronic exposure to nonionizing radiation: Secondary | ICD-10-CM | POA: Diagnosis not present

## 2021-02-12 DIAGNOSIS — L570 Actinic keratosis: Secondary | ICD-10-CM | POA: Diagnosis not present

## 2021-02-12 DIAGNOSIS — L219 Seborrheic dermatitis, unspecified: Secondary | ICD-10-CM | POA: Diagnosis not present

## 2021-02-12 DIAGNOSIS — L821 Other seborrheic keratosis: Secondary | ICD-10-CM | POA: Diagnosis not present

## 2021-02-12 DIAGNOSIS — D225 Melanocytic nevi of trunk: Secondary | ICD-10-CM | POA: Diagnosis not present

## 2021-03-09 DIAGNOSIS — G5603 Carpal tunnel syndrome, bilateral upper limbs: Secondary | ICD-10-CM | POA: Diagnosis not present

## 2021-03-09 DIAGNOSIS — R202 Paresthesia of skin: Secondary | ICD-10-CM | POA: Diagnosis not present

## 2021-03-09 DIAGNOSIS — M5412 Radiculopathy, cervical region: Secondary | ICD-10-CM | POA: Diagnosis not present

## 2021-03-09 DIAGNOSIS — M542 Cervicalgia: Secondary | ICD-10-CM | POA: Diagnosis not present

## 2021-08-02 DIAGNOSIS — Z23 Encounter for immunization: Secondary | ICD-10-CM | POA: Diagnosis not present

## 2021-12-08 DIAGNOSIS — E7800 Pure hypercholesterolemia, unspecified: Secondary | ICD-10-CM | POA: Diagnosis not present

## 2021-12-08 DIAGNOSIS — G629 Polyneuropathy, unspecified: Secondary | ICD-10-CM | POA: Diagnosis not present

## 2021-12-08 DIAGNOSIS — I10 Essential (primary) hypertension: Secondary | ICD-10-CM | POA: Diagnosis not present

## 2021-12-08 DIAGNOSIS — Z125 Encounter for screening for malignant neoplasm of prostate: Secondary | ICD-10-CM | POA: Diagnosis not present

## 2023-01-05 DIAGNOSIS — R350 Frequency of micturition: Secondary | ICD-10-CM | POA: Diagnosis not present

## 2023-01-31 DIAGNOSIS — E7800 Pure hypercholesterolemia, unspecified: Secondary | ICD-10-CM | POA: Diagnosis not present

## 2023-01-31 DIAGNOSIS — R0981 Nasal congestion: Secondary | ICD-10-CM | POA: Diagnosis not present

## 2023-01-31 DIAGNOSIS — G629 Polyneuropathy, unspecified: Secondary | ICD-10-CM | POA: Diagnosis not present

## 2023-01-31 DIAGNOSIS — I10 Essential (primary) hypertension: Secondary | ICD-10-CM | POA: Diagnosis not present

## 2023-01-31 DIAGNOSIS — Z0000 Encounter for general adult medical examination without abnormal findings: Secondary | ICD-10-CM | POA: Diagnosis not present

## 2023-01-31 DIAGNOSIS — Z23 Encounter for immunization: Secondary | ICD-10-CM | POA: Diagnosis not present

## 2023-02-22 DIAGNOSIS — R0981 Nasal congestion: Secondary | ICD-10-CM | POA: Diagnosis not present

## 2023-04-26 DIAGNOSIS — R0981 Nasal congestion: Secondary | ICD-10-CM | POA: Diagnosis not present

## 2023-05-23 DIAGNOSIS — L821 Other seborrheic keratosis: Secondary | ICD-10-CM | POA: Diagnosis not present

## 2023-05-23 DIAGNOSIS — L578 Other skin changes due to chronic exposure to nonionizing radiation: Secondary | ICD-10-CM | POA: Diagnosis not present

## 2023-05-23 DIAGNOSIS — Z85828 Personal history of other malignant neoplasm of skin: Secondary | ICD-10-CM | POA: Diagnosis not present

## 2023-05-23 DIAGNOSIS — D225 Melanocytic nevi of trunk: Secondary | ICD-10-CM | POA: Diagnosis not present

## 2023-05-23 DIAGNOSIS — D0462 Carcinoma in situ of skin of left upper limb, including shoulder: Secondary | ICD-10-CM | POA: Diagnosis not present

## 2023-05-23 DIAGNOSIS — L905 Scar conditions and fibrosis of skin: Secondary | ICD-10-CM | POA: Diagnosis not present

## 2023-05-23 DIAGNOSIS — W57XXXA Bitten or stung by nonvenomous insect and other nonvenomous arthropods, initial encounter: Secondary | ICD-10-CM | POA: Diagnosis not present

## 2023-05-23 DIAGNOSIS — D485 Neoplasm of uncertain behavior of skin: Secondary | ICD-10-CM | POA: Diagnosis not present

## 2023-05-23 DIAGNOSIS — L570 Actinic keratosis: Secondary | ICD-10-CM | POA: Diagnosis not present

## 2023-08-23 DIAGNOSIS — R350 Frequency of micturition: Secondary | ICD-10-CM | POA: Diagnosis not present

## 2023-08-29 DIAGNOSIS — L570 Actinic keratosis: Secondary | ICD-10-CM | POA: Diagnosis not present

## 2023-08-29 DIAGNOSIS — D099 Carcinoma in situ, unspecified: Secondary | ICD-10-CM | POA: Diagnosis not present

## 2023-12-27 DIAGNOSIS — L570 Actinic keratosis: Secondary | ICD-10-CM | POA: Diagnosis not present

## 2024-02-22 DIAGNOSIS — D485 Neoplasm of uncertain behavior of skin: Secondary | ICD-10-CM | POA: Diagnosis not present

## 2024-02-22 DIAGNOSIS — D2239 Melanocytic nevi of other parts of face: Secondary | ICD-10-CM | POA: Diagnosis not present

## 2024-02-22 DIAGNOSIS — L308 Other specified dermatitis: Secondary | ICD-10-CM | POA: Diagnosis not present

## 2024-02-22 DIAGNOSIS — L309 Dermatitis, unspecified: Secondary | ICD-10-CM | POA: Diagnosis not present

## 2024-02-22 DIAGNOSIS — L820 Inflamed seborrheic keratosis: Secondary | ICD-10-CM | POA: Diagnosis not present

## 2024-02-22 DIAGNOSIS — D2339 Other benign neoplasm of skin of other parts of face: Secondary | ICD-10-CM | POA: Diagnosis not present

## 2024-03-06 DIAGNOSIS — E7800 Pure hypercholesterolemia, unspecified: Secondary | ICD-10-CM | POA: Diagnosis not present

## 2024-03-06 DIAGNOSIS — R7301 Impaired fasting glucose: Secondary | ICD-10-CM | POA: Diagnosis not present

## 2024-03-06 DIAGNOSIS — R609 Edema, unspecified: Secondary | ICD-10-CM | POA: Diagnosis not present

## 2024-03-06 DIAGNOSIS — I10 Essential (primary) hypertension: Secondary | ICD-10-CM | POA: Diagnosis not present

## 2024-03-06 DIAGNOSIS — Z0000 Encounter for general adult medical examination without abnormal findings: Secondary | ICD-10-CM | POA: Diagnosis not present

## 2024-04-19 DIAGNOSIS — R7301 Impaired fasting glucose: Secondary | ICD-10-CM | POA: Diagnosis not present

## 2024-06-05 DIAGNOSIS — L820 Inflamed seborrheic keratosis: Secondary | ICD-10-CM | POA: Diagnosis not present

## 2024-06-05 DIAGNOSIS — L578 Other skin changes due to chronic exposure to nonionizing radiation: Secondary | ICD-10-CM | POA: Diagnosis not present

## 2024-06-05 DIAGNOSIS — D485 Neoplasm of uncertain behavior of skin: Secondary | ICD-10-CM | POA: Diagnosis not present

## 2024-06-05 DIAGNOSIS — Z85828 Personal history of other malignant neoplasm of skin: Secondary | ICD-10-CM | POA: Diagnosis not present

## 2024-06-05 DIAGNOSIS — L821 Other seborrheic keratosis: Secondary | ICD-10-CM | POA: Diagnosis not present

## 2024-06-05 DIAGNOSIS — D225 Melanocytic nevi of trunk: Secondary | ICD-10-CM | POA: Diagnosis not present

## 2024-06-05 DIAGNOSIS — L570 Actinic keratosis: Secondary | ICD-10-CM | POA: Diagnosis not present

## 2024-09-16 ENCOUNTER — Encounter: Payer: Self-pay | Admitting: *Deleted

## 2024-09-16 NOTE — Progress Notes (Signed)
 Jerry Ramos                                          MRN: [id number]   09/16/2024   The [facility] Quality Team Specialist reviewed this patient medical record for the purposes of chart review for care gap closure. The following were reviewed: chart review for care gap closure-colorectal cancer screening. Care everywhere states colonoscopy completed 06/07/2018, no documentation provided, in MD notes, labs or anesthesiology notes.    [facility] Quality Team

## 2024-09-17 DIAGNOSIS — H401132 Primary open-angle glaucoma, bilateral, moderate stage: Secondary | ICD-10-CM | POA: Diagnosis not present

## 2024-09-17 DIAGNOSIS — H2513 Age-related nuclear cataract, bilateral: Secondary | ICD-10-CM | POA: Diagnosis not present

## 2024-09-17 DIAGNOSIS — H47233 Glaucomatous optic atrophy, bilateral: Secondary | ICD-10-CM | POA: Diagnosis not present

## 2024-09-17 DIAGNOSIS — H53433 Sector or arcuate defects, bilateral: Secondary | ICD-10-CM | POA: Diagnosis not present
